# Patient Record
Sex: Male | Born: 2000 | Race: Black or African American | Hispanic: No | Marital: Single
Health system: Southern US, Community
[De-identification: ages and names within clinical notes are randomized; demographics above are authoritative.]

---
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
## 2019-02-28 ENCOUNTER — Emergency Department (HOSPITAL_COMMUNITY): Payer: No Typology Code available for payment source

## 2019-02-28 ENCOUNTER — Emergency Department (HOSPITAL_COMMUNITY)
Admission: EM | Admit: 2019-02-28 | Discharge: 2019-02-28 | Disposition: A | Discharge status: Home / Self Care | Payer: No Typology Code available for payment source | Attending: Emergency Medicine | Admitting: Emergency Medicine

## 2019-02-28 ENCOUNTER — Encounter (HOSPITAL_COMMUNITY): Payer: Self-pay

## 2019-02-28 ENCOUNTER — Other Ambulatory Visit: Payer: Self-pay

## 2019-02-28 DIAGNOSIS — M25511 Pain in right shoulder: Secondary | ICD-10-CM | POA: Insufficient documentation

## 2019-02-28 DIAGNOSIS — R1030 Lower abdominal pain, unspecified: Secondary | ICD-10-CM | POA: Insufficient documentation

## 2019-02-28 DIAGNOSIS — M25552 Pain in left hip: Secondary | ICD-10-CM | POA: Diagnosis present

## 2019-02-28 DIAGNOSIS — Z7722 Contact with and (suspected) exposure to environmental tobacco smoke (acute) (chronic): Secondary | ICD-10-CM | POA: Diagnosis not present

## 2019-02-28 DIAGNOSIS — Z79899 Other long term (current) drug therapy: Secondary | ICD-10-CM | POA: Diagnosis not present

## 2019-02-28 DIAGNOSIS — R52 Pain, unspecified: Secondary | ICD-10-CM

## 2019-02-28 MED ORDER — SODIUM CHLORIDE (PF) 0.9 % IJ SOLN
INTRAMUSCULAR | Status: AC
  Filled 2019-02-28: qty 50

## 2019-02-28 MED ORDER — IOHEXOL 300 MG/ML  SOLN
100.0000 mL | Freq: Once | INTRAMUSCULAR | Status: AC | PRN
  Administered 2019-02-28: 100 mL via INTRAVENOUS

## 2019-02-28 MED ORDER — OXYCODONE HCL 5 MG PO TABS
5.0000 mg | ORAL_TABLET | Freq: Once | ORAL | Status: AC
  Administered 2019-02-28: 5 mg via ORAL
  Filled 2019-02-28: qty 1

## 2019-02-28 MED ORDER — NAPROXEN 500 MG PO TABS: 500.0000 mg | ORAL_TABLET | Freq: Two times a day (BID) | ORAL | 0 refills | Status: DC | PRN

## 2019-02-28 MED ORDER — METHOCARBAMOL 500 MG PO TABS: 500.0000 mg | ORAL_TABLET | Freq: Two times a day (BID) | ORAL | 0 refills | Status: AC | PRN

## 2019-02-28 MED ORDER — ACETAMINOPHEN 500 MG PO TABS
1000.0000 mg | ORAL_TABLET | Freq: Once | ORAL | Status: AC
  Administered 2019-02-28: 1000 mg via ORAL
  Filled 2019-02-28: qty 2
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
## 2019-02-28 NOTE — ED Triage Notes (Signed)
Pt also states left shoudler pain.

## 2019-02-28 NOTE — ED Notes (Signed)
Patient transported to CT 

## 2019-02-28 NOTE — Discharge Instructions (Signed)

## 2019-12-23 ENCOUNTER — Emergency Department: Payer: Medicaid Other

## 2019-12-23 ENCOUNTER — Emergency Department
Admission: EM | Admit: 2019-12-23 | Discharge: 2019-12-23 | Disposition: A | Discharge status: Home / Self Care | Payer: Medicaid Other | Attending: Emergency Medicine | Admitting: Emergency Medicine

## 2019-12-23 ENCOUNTER — Other Ambulatory Visit: Payer: Self-pay

## 2019-12-23 ENCOUNTER — Encounter: Payer: Self-pay | Admitting: Emergency Medicine

## 2019-12-23 DIAGNOSIS — Z79899 Other long term (current) drug therapy: Secondary | ICD-10-CM | POA: Diagnosis not present

## 2019-12-23 DIAGNOSIS — Z7722 Contact with and (suspected) exposure to environmental tobacco smoke (acute) (chronic): Secondary | ICD-10-CM | POA: Diagnosis not present

## 2019-12-23 DIAGNOSIS — X509XXA Other and unspecified overexertion or strenuous movements or postures, initial encounter: Secondary | ICD-10-CM | POA: Insufficient documentation

## 2019-12-23 DIAGNOSIS — S4992XA Unspecified injury of left shoulder and upper arm, initial encounter: Secondary | ICD-10-CM | POA: Diagnosis present

## 2019-12-23 DIAGNOSIS — Y929 Unspecified place or not applicable: Secondary | ICD-10-CM | POA: Diagnosis not present

## 2019-12-23 DIAGNOSIS — S43015A Anterior dislocation of left humerus, initial encounter: Secondary | ICD-10-CM

## 2019-12-23 DIAGNOSIS — Y999 Unspecified external cause status: Secondary | ICD-10-CM | POA: Diagnosis not present

## 2019-12-23 DIAGNOSIS — Y9367 Activity, basketball: Secondary | ICD-10-CM | POA: Insufficient documentation

## 2019-12-23 MED ORDER — OXYCODONE-ACETAMINOPHEN 5-325 MG PO TABS
1.0000 | ORAL_TABLET | ORAL | Status: AC | PRN
  Administered 2019-12-23 (×2): 1 via ORAL
  Filled 2019-12-23 (×2): qty 1

## 2019-12-23 MED ORDER — KETOROLAC TROMETHAMINE 10 MG PO TABS
10.0000 mg | ORAL_TABLET | Freq: Once | ORAL | Status: AC
  Administered 2019-12-23: 23:00:00 10 mg via ORAL

## 2019-12-23 MED ORDER — OXYCODONE-ACETAMINOPHEN 5-325 MG PO TABS: 1.0000 | ORAL_TABLET | Freq: Four times a day (QID) | ORAL | 0 refills | Status: AC | PRN

## 2019-12-23 MED ORDER — HYDROMORPHONE HCL 1 MG/ML IJ SOLN
1.0000 mg | Freq: Once | INTRAMUSCULAR | Status: AC
  Administered 2019-12-23: 20:00:00 1 mg via INTRAMUSCULAR

## 2019-12-23 MED ORDER — HYDROMORPHONE HCL 1 MG/ML IJ SOLN
INTRAMUSCULAR | Status: AC
  Filled 2019-12-23: qty 1

## 2019-12-23 MED ORDER — KETOROLAC TROMETHAMINE 10 MG PO TABS: 10.0000 mg | ORAL_TABLET | Freq: Four times a day (QID) | ORAL | 0 refills | Status: AC | PRN
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
## 2019-12-23 NOTE — ED Provider Notes (Signed)
[facility] Emergency Department Provider Note  ____________________________________________  Time seen: Approximately 10:54 PM  I have reviewed the triage vital signs and the nursing notes.   HISTORY  Chief Complaint Shoulder Pain    HPI Peter Farrell is a 19 y.o. male with history of shoulder dislocation status post rotator cuff surgery in the past who comes the ED complaining of left shoulder pain feeling like a dislocation that started about 2 hours prior to arrival.  Injury occurred while playing basketball.  Sudden severe pain, nonradiating, worse with movement, no alleviating factors.  No other injuries.     History reviewed. No pertinent past medical history.   There are no problems to display for this patient.    History reviewed. No pertinent surgical history.   Prior to Admission medications   Medication Sig Start Date End Date Taking? Authorizing Provider  clotrimazole-betamethasone (LOTRISONE) cream Apply 1 application topically 2 (two) times daily. 06/29/16   Felecia Shelling, DPM  fluticasone (FLONASE) 50 MCG/ACT nasal spray Place 2 sprays into both nostrils daily. 02/20/16   Joanna Puff, MD  ibuprofen (ADVIL,MOTRIN) 400 MG tablet Take 1 tablet (400 mg total) by mouth every 6 (six) hours as needed. 06/17/15   Mady Gemma, PA-C  ketorolac (TORADOL) 10 MG tablet Take 1 tablet (10 mg total) by mouth every 6 (six) hours as needed for moderate pain. 12/23/19   Sharman Cheek, MD  loratadine (CLARITIN) 10 MG tablet Take 1 tablet (10 mg total) by mouth daily. 05/12/16   Sharene Skeans, MD  methocarbamol (ROBAXIN) 500 MG tablet Take 1 tablet (500 mg total) by mouth 2 (two) times daily as needed for muscle spasms. 02/28/19   Ward, Chase Picket, PA-C  naproxen (NAPROSYN) 500 MG tablet Take 1 tablet (500 mg total) by mouth 2 (two) times daily as needed. 02/28/19   Ward, Chase Picket, PA-C  oxyCODONE-acetaminophen (PERCOCET) 5-325 MG tablet  Take 1 tablet by mouth every 6 (six) hours as needed for severe pain. 12/23/19 12/22/20  Sharman Cheek, MD  terbinafine (LAMISIL) 250 MG tablet Take 1 tablet (250 mg total) by mouth daily. 07/15/16   Felecia Shelling, DPM     Allergies Patient has no known allergies.   History reviewed. No pertinent family history.  Social History Social History   Tobacco Use  . Smoking status: Passive Smoke Exposure - Never Smoker  . Smokeless tobacco: Never Used  Substance Use Topics  . Alcohol use: No  . Drug use: Not on file    Review of Systems  Constitutional:   No fever or chills.  ENT:   No sore throat. No rhinorrhea. Cardiovascular:   No chest pain or syncope. Respiratory:   No dyspnea or cough. Gastrointestinal:   Negative for abdominal pain, vomiting and diarrhea.  Musculoskeletal:   Left shoulder pain as above All other systems reviewed and are negative except as documented above in ROS and HPI.  ____________________________________________   PHYSICAL EXAM:  VITAL SIGNS: ED Triage Vitals  Enc Vitals Group     BP 12/23/19 1950 135/88     Pulse Rate 12/23/19 1950 68     Resp 12/23/19 1950 17     Temp 12/23/19 1950 98 F (36.7 C)     Temp Source 12/23/19 1950 Oral     SpO2 12/23/19 1950 100 %     Weight --      Height --      Head Circumference --  Peak Flow --      Pain Score 12/23/19 2111 10     Pain Loc --      Pain Edu? --      Excl. in GC? --     Vital signs reviewed, nursing assessments reviewed.   Constitutional:   Alert and oriented. Non-toxic appearance. Eyes:   Conjunctivae are normal. EOMI. ENT      Head:   Normocephalic and atraumatic.      Mouth/Throat:   MMM      Neck:   No meningismus. Full ROM. Hematological/Lymphatic/Immunilogical:   No cervical lymphadenopathy. Cardiovascular:   RRR. Symmetric bilateral radial and DP pulses.  No murmurs. Cap refill less than 2 seconds. Respiratory:   Normal respiratory effort without  tachypnea/retractions. Breath sounds are clear and equal bilaterally. No wheezes/rales/rhonchi. Musculoskeletal:   Left shoulder held in internal rotation.  Severe pain with movement.  Emptiness over the lateral deltoid and anterior fullness, clinically consistent with an anterior dislocation. Neurologic:   Normal speech and language.  Motor grossly intact. No acute focal neurologic deficits are appreciated.  Skin:    Skin is warm, dry and intact. No rash noted.  No wounds.  ____________________________________________    LABS (pertinent positives/negatives) (all labs ordered are listed, but only abnormal results are displayed) Labs Reviewed - No data to display ____________________________________________   EKG  ____________________________________________    RADIOLOGY  DG Shoulder Left  Result Date: 12/23/2019 CLINICAL DATA:  Larey Seat playing basketball today, pain EXAM: LEFT SHOULDER - 2+ VIEW COMPARISON:  None. FINDINGS: Internal rotation, external rotation, transscapular views of the left shoulder demonstrate anterior glenohumeral dislocation. No associated fracture. Acromioclavicular joint is unremarkable. Left chest is clear. IMPRESSION: 1. Anterior glenohumeral dislocation. Electronically Signed   By: Sharlet Salina M.D.   On: 12/23/2019 20:19   DG Shoulder Left Portable  Result Date: 12/23/2019 CLINICAL DATA:  Post reduction radiographs of the anterior dislocation. EXAM: LEFT SHOULDER COMPARISON:  Same day radiographs FINDINGS: Successful relocation of the humeral head, now seated normally within the glenoid. A Hill-Sachs deformity is present along the posterolateral humeral head. No irregularity is seen along the anteroinferior glenoid is suggest a bony Bankart lesion. Mild residual soft tissue swelling. Included portion of the left chest wall and lung are unremarkable. IMPRESSION: 1. Successful relocation of the humeral head, now seated within the glenoid. 2. Hill-Sachs deformity  along the posterolateral humeral head. 3. No convincing evidence of a bony Bankart lesion. Electronically Signed   By: Kreg Shropshire M.D.   On: 12/23/2019 21:40    ____________________________________________   PROCEDURES .Ortho Injury Treatment  Date/Time: 12/23/2019 10:56 PM Performed by: Sharman Cheek, MD Authorized by: Sharman Cheek, MD   Consent:    Consent obtained:  Verbal   Consent given by:  Patient   Risks discussed:  Irreducible dislocation, recurrent dislocation, stiffness and restricted joint movement   Alternatives discussed:  No treatment, alternative treatment and referralInjury location: shoulder Location details: left shoulder Injury type: dislocation Dislocation type: anterior Hill-Sachs deformity: yes Chronicity: recurrent Pre-procedure neurovascular assessment: neurovascularly intact Pre-procedure distal perfusion: normal Pre-procedure neurological function: normal Pre-procedure range of motion: reduced  Anesthesia: Local anesthesia used: no  Patient sedated: NoManipulation performed: yes Reduction method: external rotation (Prakash method) Reduction successful: yes X-ray confirmed reduction: yes Immobilization: sling Post-procedure neurovascular assessment: post-procedure neurovascularly intact Post-procedure distal perfusion: normal Post-procedure neurological function: normal Post-procedure range of motion: normal Patient tolerance: patient tolerated the procedure well with no immediate complications     ____________________________________________  CLINICAL IMPRESSION / ASSESSMENT AND PLAN / ED COURSE  Pertinent labs & imaging results that were available during my care of the patient were reviewed by me and considered in my medical decision making (see chart for details).  Peter Farrell was evaluated in Emergency Department on 12/23/2019 for the symptoms described in the history of present illness. He was evaluated in the context of  the global COVID-19 pandemic, which necessitated consideration that the patient might be at risk for infection with the SARS-CoV-2 virus that causes COVID-19. Institutional protocols and algorithms that pertain to the evaluation of patients at risk for COVID-19 are in a state of rapid change based on information released by regulatory bodies including the CDC and federal and state organizations. These policies and algorithms were followed during the patient's care in the ED.     Clinical Course as of Dec 23 2302  Sat Dec 23, 2019  2032 X-ray images viewed by me, shows anterior dislocation of left shoulder without fracture.  Radiology report reviewed as well.  No other apparent injuries on exam, vital signs are normal.  Patient unable to tolerate manipulation of the shoulder on arrival to treatment room, studies given Dilaudid 1 mg IM, and will reattempt after pain better controlled.   [PS]  2111 Reduced with Edmonia James method, will get repeat xr.    [PS]  2140 Repeat xray images interpreted by me, shows successful rduction, no fracture.     [PS]  2143 Radiology report confirms successful reduction. Notes hill sachs deformity at posterolateral humeral head. Unclear if acute given multiple prior dislocations. Will refer to ortho, control pain. Rec ice and nsaids.    [PS]    Clinical Course User Index [PS] Carrie Mew, MD     ____________________________________________   FINAL CLINICAL IMPRESSION(S) / ED DIAGNOSES    Final diagnoses:  Anterior shoulder dislocation, left, initial encounter     ED Discharge Orders         Ordered    ketorolac (TORADOL) 10 MG tablet  Every 6 hours PRN     12/23/19 2249    oxyCODONE-acetaminophen (PERCOCET) 5-325 MG tablet  Every 6 hours PRN     12/23/19 2249          Portions of this note were generated with dragon dictation software. Dictation errors may occur despite best attempts at proofreading.   Carrie Mew, MD 12/23/19  [phone]

## 2019-12-23 NOTE — ED Notes (Signed)
Patient to waiting room via wheelchair by EMS for left shoulder pain.  Patient with history of dislocation to left shoulder previous with torn rotator cuff.

## 2019-12-23 NOTE — ED Triage Notes (Signed)
Pt arrived via EMS for possible left shoulder dislocation. Pt reports this will be the 4th dislocation to said shoulder.

## 2019-12-23 NOTE — ED Notes (Signed)
Pt states meds are taking effect. Will notify MD.

## 2019-12-23 NOTE — ED Notes (Signed)
MD at bedside. Pt in pain. New order received. Pt given Dilaudid. Pt states he dislocated shoulder while playing basketball. Hx of same.

## 2019-12-23 NOTE — Discharge Instructions (Addendum)
Keep the sling immobilizer on at all times except showering for the next week and follow up with orthopedics.  Do not raise your arm above chest height until then to avoid dislocating again.
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
## 2021-06-09 IMAGING — DX DG SHOULDER 1V*L*
3 series · 3 of 3 positions shown · non-contrast
Comparison: Same day radiographs

CLINICAL DATA: Post reduction radiographs of the anterior
dislocation.

EXAM:
LEFT SHOULDER

[shoulder ap (1 of 2)]
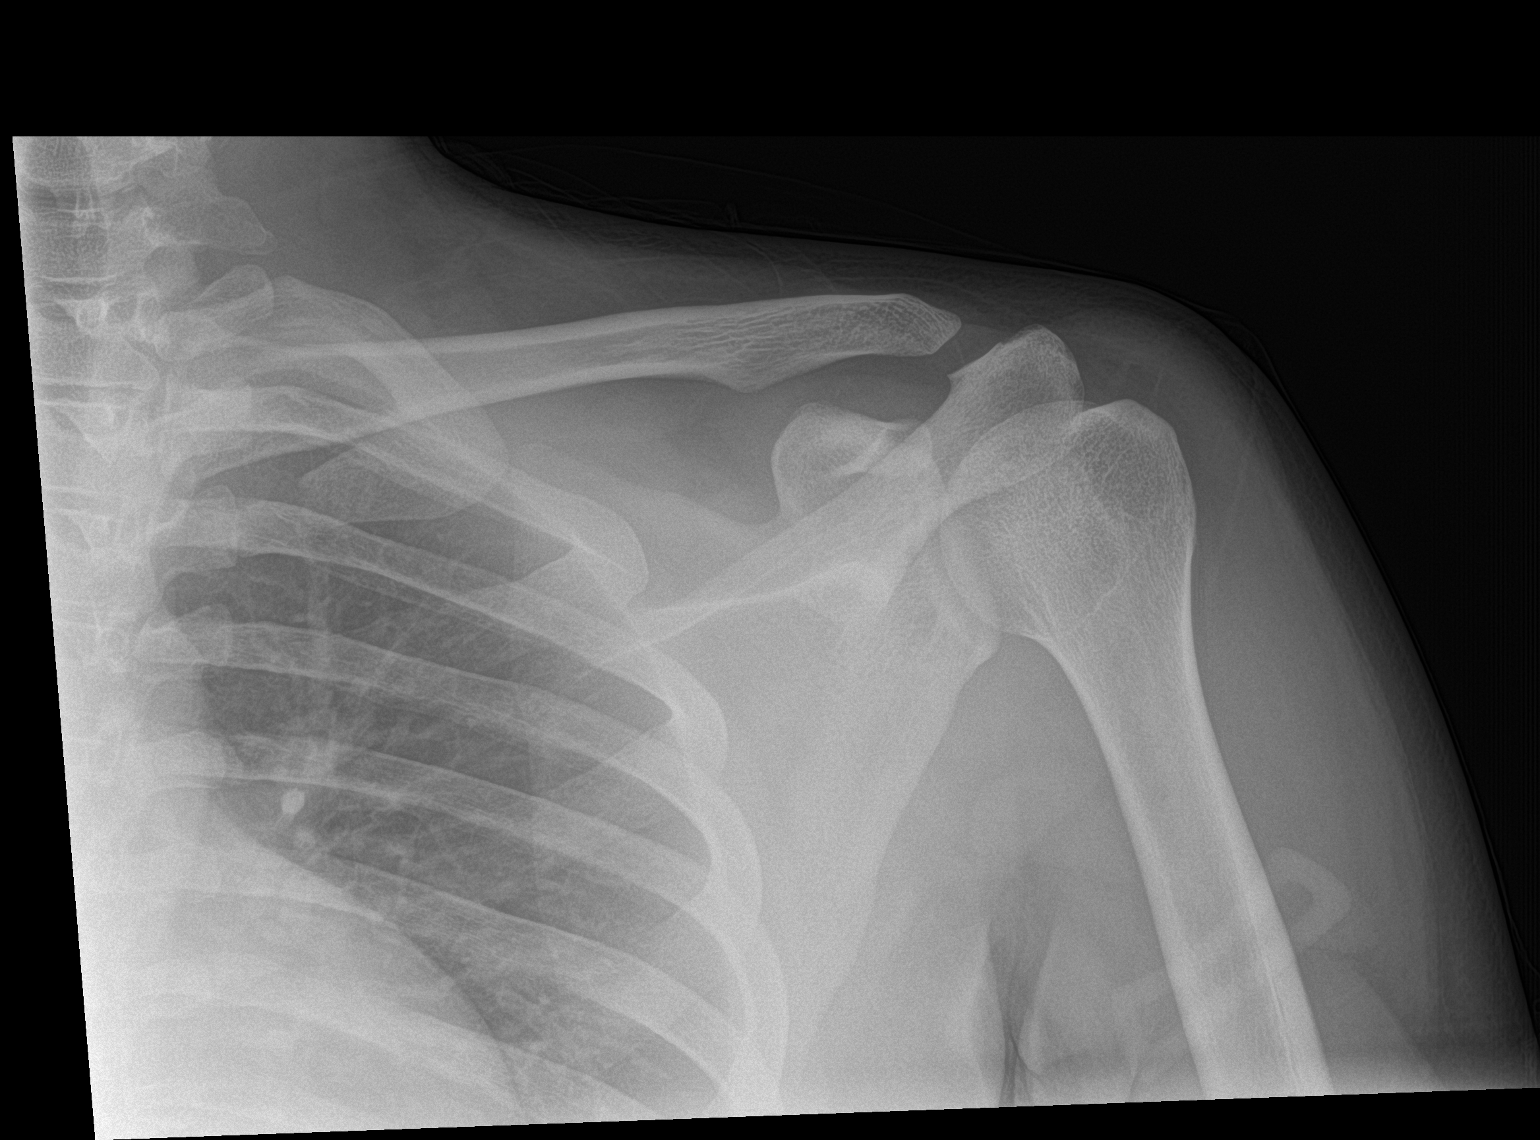

[shoulder ap (2 of 2)]
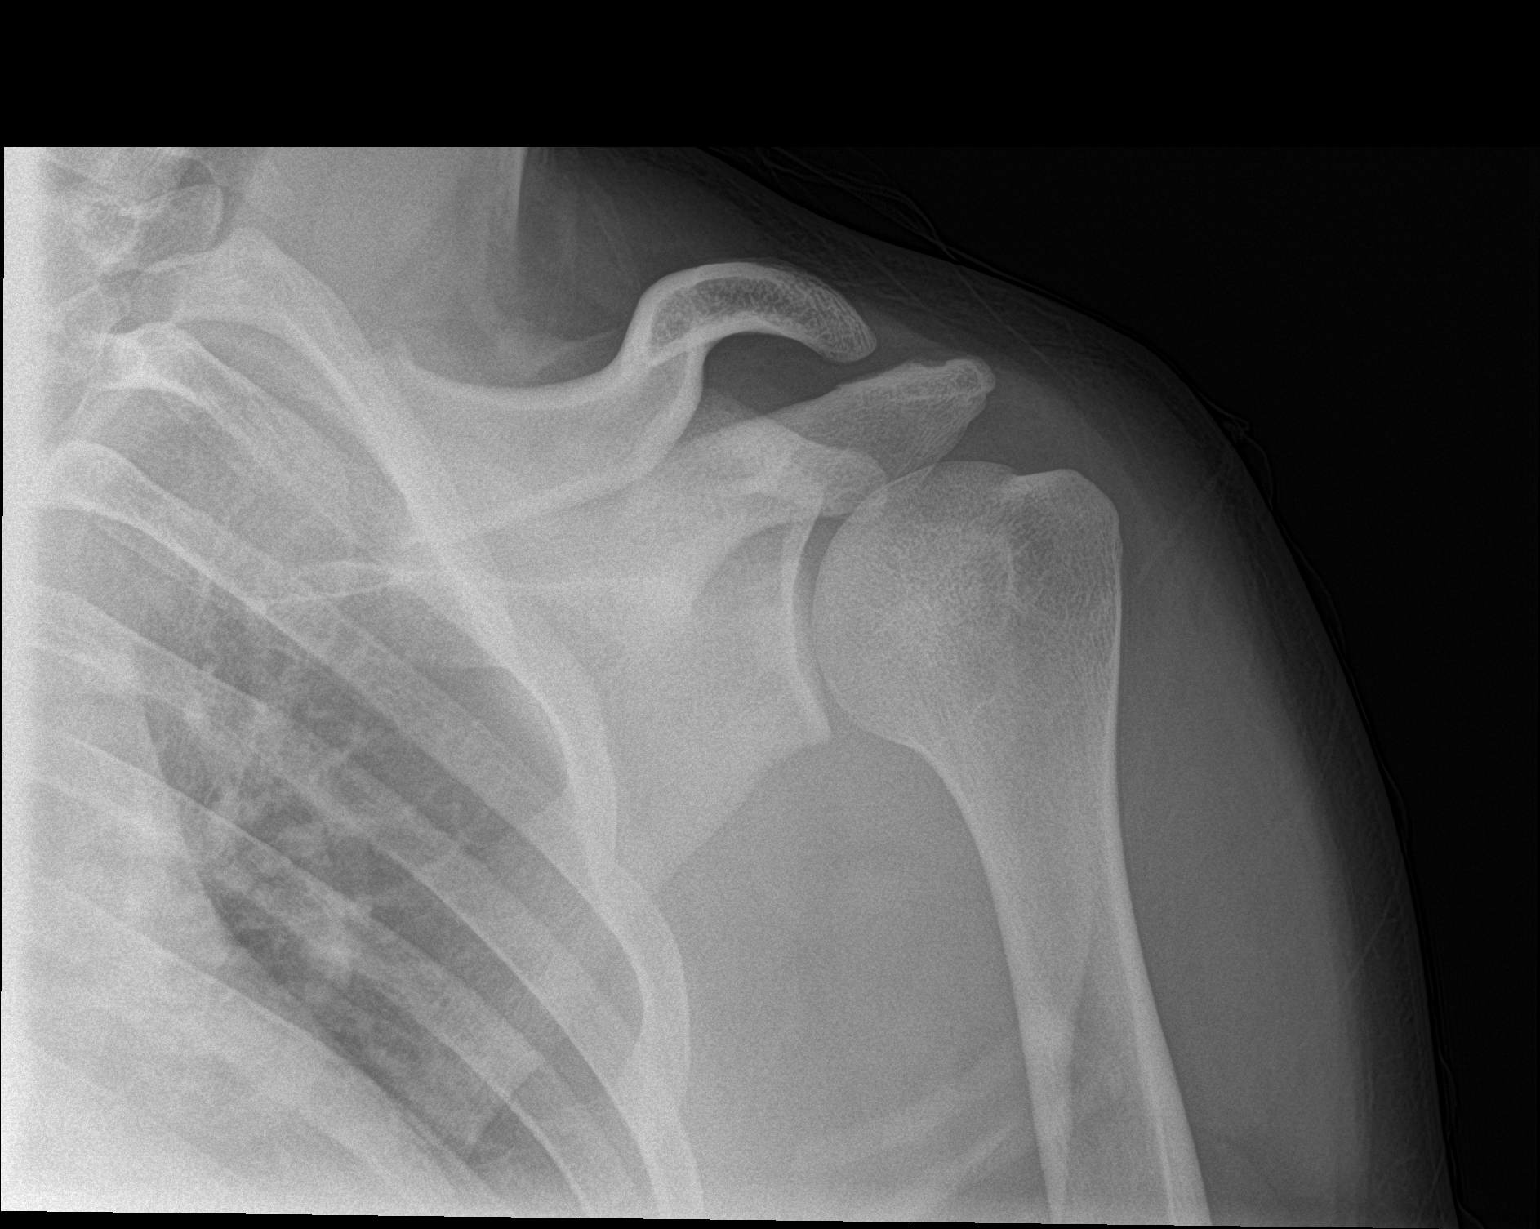

[shoulder obl]
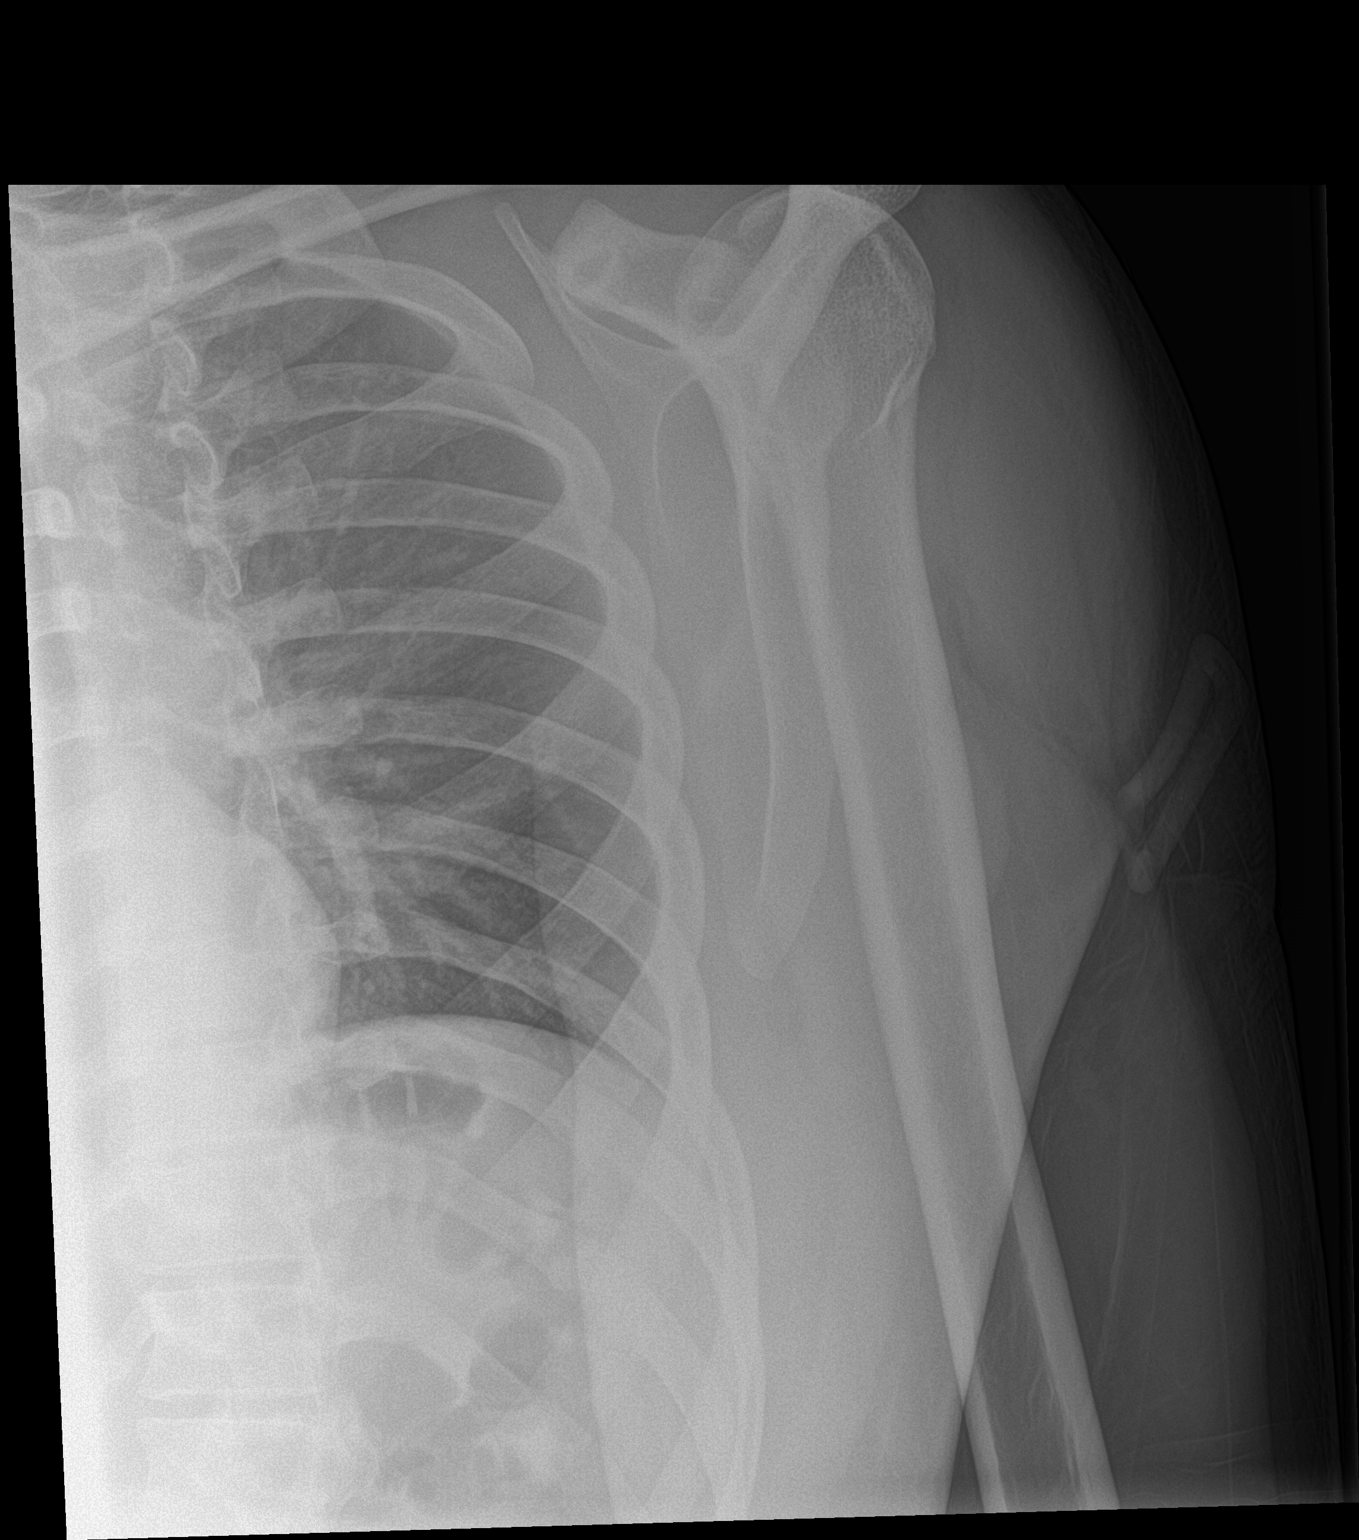

[3 of 3 positions shown; findings below may reference images not displayed]

FINDINGS: Successful relocation of the humeral head, now seated normally
within the glenoid. A Hill-Sachs deformity is present along the
posterolateral humeral head. No irregularity is seen along the
anteroinferior glenoid is suggest a bony Bankart lesion. Mild
residual soft tissue swelling. Included portion of the left chest
wall and lung are unremarkable.
IMPRESSION: 1. Successful relocation of the humeral head, now seated within the
glenoid.
2. Hill-Sachs deformity along the posterolateral humeral head.
3. No convincing evidence of a bony Bankart lesion.
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
## 2022-09-01 ENCOUNTER — Ambulatory Visit (HOSPITAL_COMMUNITY)
Admission: EM | Admit: 2022-09-01 | Discharge: 2022-09-01 | Disposition: A | Discharge status: Home / Self Care | Payer: Medicaid Other

## 2022-09-01 NOTE — ED Notes (Signed)
Called for triage no response 

## 2023-04-02 ENCOUNTER — Inpatient Hospital Stay (HOSPITAL_COMMUNITY): Admission: RE | Admit: 2023-04-02 | Payer: Medicaid Other | Source: Ambulatory Visit

## 2024-05-01 ENCOUNTER — Emergency Department (HOSPITAL_COMMUNITY): Payer: Self-pay

## 2024-05-01 ENCOUNTER — Emergency Department (HOSPITAL_COMMUNITY)
Admission: EM | Admit: 2024-05-01 | Discharge: 2024-05-01 | Disposition: A | Discharge status: Home / Self Care | Payer: Self-pay | Attending: Emergency Medicine | Admitting: Emergency Medicine

## 2024-05-01 ENCOUNTER — Other Ambulatory Visit: Payer: Self-pay

## 2024-05-01 DIAGNOSIS — Y906 Blood alcohol level of 120-199 mg/100 ml: Secondary | ICD-10-CM | POA: Insufficient documentation

## 2024-05-01 DIAGNOSIS — S0001XA Abrasion of scalp, initial encounter: Secondary | ICD-10-CM

## 2024-05-01 DIAGNOSIS — Z23 Encounter for immunization: Secondary | ICD-10-CM | POA: Diagnosis not present

## 2024-05-01 DIAGNOSIS — F1090 Alcohol use, unspecified, uncomplicated: Secondary | ICD-10-CM | POA: Diagnosis not present

## 2024-05-01 DIAGNOSIS — Y92410 Unspecified street and highway as the place of occurrence of the external cause: Secondary | ICD-10-CM | POA: Diagnosis not present

## 2024-05-01 DIAGNOSIS — S0103XA Puncture wound without foreign body of scalp, initial encounter: Secondary | ICD-10-CM | POA: Diagnosis not present

## 2024-05-01 DIAGNOSIS — S0990XA Unspecified injury of head, initial encounter: Secondary | ICD-10-CM | POA: Diagnosis present

## 2024-05-01 LAB — I-STAT CG4 LACTIC ACID, ED: Lactic Acid, Venous: 1.6 mmol/L (ref 0.5–1.9)

## 2024-05-01 LAB — ETHANOL: Alcohol, Ethyl (B): 157 mg/dL — ABNORMAL HIGH (ref <15)

## 2024-05-01 LAB — COMPREHENSIVE METABOLIC PANEL WITH GFR
ALT: 18 U/L (ref 0–44)
AST: 26 U/L (ref 15–41)
Albumin: 3.8 g/dL (ref 3.5–5.0)
Alkaline Phosphatase: 69 U/L (ref 38–126)
Anion gap: 8 (ref 5–15)
BUN: <5 mg/dL — ABNORMAL LOW (ref 6–20)
CO2: 24 mmol/L (ref 22–32)
Calcium: 8.7 mg/dL — ABNORMAL LOW (ref 8.9–10.3)
Chloride: 108 mmol/L (ref 98–111)
Creatinine, Ser: 1.11 mg/dL (ref 0.61–1.24)
GFR, Estimated: >60 mL/min (ref >60)
Glucose, Bld: 90 mg/dL (ref 70–99)
Potassium: 3.3 mmol/L — ABNORMAL LOW (ref 3.5–5.1)
Sodium: 140 mmol/L (ref 135–145)
Total Bilirubin: 0.9 mg/dL (ref 0.0–1.2)
Total Protein: 7 g/dL (ref 6.5–8.1)

## 2024-05-01 LAB — PROTIME-INR
INR: 1.1 (ref 0.8–1.2)
Prothrombin Time: 14.3 s (ref 11.4–15.2)

## 2024-05-01 LAB — CBC
HCT: 44.6 % (ref 39.0–52.0)
Hemoglobin: 15.3 g/dL (ref 13.0–17.0)
MCH: 31.9 pg (ref 26.0–34.0)
MCHC: 34.3 g/dL (ref 30.0–36.0)
MCV: 93.1 fL (ref 80.0–100.0)
Platelets: 239 K/uL (ref 150–400)
RBC: 4.79 MIL/uL (ref 4.22–5.81)
RDW: 14.6 % (ref 11.5–15.5)
WBC: 5.6 K/uL (ref 4.0–10.5)
nRBC: 0 % (ref 0.0–0.2)

## 2024-05-01 LAB — I-STAT CHEM 8, ED
BUN: <3 mg/dL — ABNORMAL LOW (ref 6–20)
Calcium, Ion: 1.1 mmol/L — ABNORMAL LOW (ref 1.15–1.40)
Chloride: 104 mmol/L (ref 98–111)
Creatinine, Ser: 1.2 mg/dL (ref 0.61–1.24)
Glucose, Bld: 90 mg/dL (ref 70–99)
HCT: 47 % (ref 39.0–52.0)
Hemoglobin: 16 g/dL (ref 13.0–17.0)
Potassium: 3.3 mmol/L — ABNORMAL LOW (ref 3.5–5.1)
Sodium: 143 mmol/L (ref 135–145)
TCO2: 24 mmol/L (ref 22–32)

## 2024-05-01 MED ORDER — SODIUM CHLORIDE 0.9 % IV BOLUS
1000.0000 mL | Freq: Once | INTRAVENOUS | Status: AC
  Administered 2024-05-01: 1000 mL via INTRAVENOUS

## 2024-05-01 MED ORDER — TETANUS-DIPHTH-ACELL PERTUSSIS 5-2.5-18.5 LF-MCG/0.5 IM SUSY
PREFILLED_SYRINGE | INTRAMUSCULAR | Status: AC
  Administered 2024-05-01: 1 mL via INTRAMUSCULAR
  Filled 2024-05-01: qty 0.5

## 2024-05-01 MED ORDER — IOHEXOL 350 MG/ML SOLN
75.0000 mL | Freq: Once | INTRAVENOUS | Status: AC | PRN
  Administered 2024-05-01: 75 mL via INTRAVENOUS

## 2024-05-01 NOTE — Progress Notes (Signed)
 Orthopedic Tech Progress Note Patient Details:  [name] 07-Nov-2000 [id number]  Patient ID: Peter Farrell Arrant, male   DOB: May 24, 2001, 24 y.o.   MRN: [id number] I attended trauma page. Chandra Dorn PARAS 05/01/2024, 7:02 AM

## 2024-05-01 NOTE — ED Notes (Signed)
 RN gave pt jewelry.
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
## 2024-05-01 NOTE — ED Notes (Signed)
RN updated sister

## 2024-07-03 ENCOUNTER — Emergency Department (HOSPITAL_COMMUNITY)
Admission: EM | Admit: 2024-07-03 | Discharge: 2024-07-03 | Disposition: A | Discharge status: Home / Self Care | Payer: Self-pay

## 2024-07-03 ENCOUNTER — Encounter (HOSPITAL_COMMUNITY): Payer: Self-pay

## 2024-07-03 DIAGNOSIS — L02818 Cutaneous abscess of other sites: Secondary | ICD-10-CM

## 2024-07-03 DIAGNOSIS — L02415 Cutaneous abscess of right lower limb: Secondary | ICD-10-CM | POA: Insufficient documentation

## 2024-07-03 DIAGNOSIS — L02214 Cutaneous abscess of groin: Secondary | ICD-10-CM | POA: Insufficient documentation

## 2024-07-03 MED ORDER — SULFAMETHOXAZOLE-TRIMETHOPRIM 800-160 MG PO TABS: 1.0000 | ORAL_TABLET | Freq: Two times a day (BID) | ORAL | 0 refills | Status: AC

## 2024-07-03 MED ORDER — NAPROXEN 500 MG PO TABS: 500.0000 mg | ORAL_TABLET | Freq: Two times a day (BID) | ORAL | 0 refills | Status: AC | PRN

## 2024-07-03 NOTE — Discharge Instructions (Addendum)
 Please continue to apply warm moist compress to affected area to aid with healing.  Take antibiotic as prescribed.  You may need to follow-up with a dermatologist to be evaluated to see if you have a condition called Hidradenitis Suppurativa

## 2024-07-03 NOTE — ED Provider Notes (Signed)
 [place] EMERGENCY DEPARTMENT AT [facility] Provider Note   CSN: [id number] Arrival date & time: 07/03/24  1147     Patient presents with: No chief complaint on file.   Peter Farrell is a 23 y.o. male.   The history is provided by the patient and medical records. No language interpreter was used.     23 year old male presenting with complaints of skin infection.  For the past 4 days he noticed 2 boils that appeared on his right groin area and tender to palpation.  He has tried to use warm compress without adequate relief.  He does not endorse any fever chills no recent trauma no penile discharge, new sexual partner.  He has had similar abscess that appeared in his armpit in the past.  He has never been diagnosed with hidradenitis suppurativa.  He is up-to-date with tetanus.  Prior to Admission medications   Medication Sig Start Date End Date Taking? Authorizing Provider  clotrimazole -betamethasone  (LOTRISONE ) cream Apply 1 application topically 2 (two) times daily. 06/29/16   Janit Thresa HERO, DPM  fluticasone  (FLONASE ) 50 MCG/ACT nasal spray Place 2 sprays into both nostrils daily. 02/20/16   Federico Camelia RAMAN, MD  ibuprofen  (ADVIL ,MOTRIN ) 400 MG tablet Take 1 tablet (400 mg total) by mouth every 6 (six) hours as needed. 06/17/15   Veronica Almarie BROCKS, PA-C  ketorolac  (TORADOL ) 10 MG tablet Take 1 tablet (10 mg total) by mouth every 6 (six) hours as needed for moderate pain. 12/23/19   Viviann Pastor, MD  loratadine  (CLARITIN ) 10 MG tablet Take 1 tablet (10 mg total) by mouth daily. 05/12/16   Willaim Darnel, MD  methocarbamol  (ROBAXIN ) 500 MG tablet Take 1 tablet (500 mg total) by mouth 2 (two) times daily as needed for muscle spasms. 02/28/19   Ward, Jaime Pilcher, PA-C  naproxen  (NAPROSYN ) 500 MG tablet Take 1 tablet (500 mg total) by mouth 2 (two) times daily as needed. 02/28/19   Ward, Ami Copes, PA-C  terbinafine  (LAMISIL ) 250 MG tablet Take 1 tablet (250 mg total) by  mouth daily. 07/15/16   Janit Thresa HERO, DPM    Allergies: Patient has no known allergies.    Review of Systems  Constitutional:  Negative for fever.    Updated Vital Signs BP 136/84   Pulse 87   Temp 97.9 F (36.6 C)   Resp 14   SpO2 98%   Physical Exam Constitutional:      General: He is not in acute distress.    Appearance: He is well-developed.  HENT:     Head: Atraumatic.  Eyes:     Conjunctiva/sclera: Conjunctivae normal.  Genitourinary:    Comments: Two area of induration and mild fluctuance noted to the right inguinal and right medial thigh region with tenderness to palpation. Musculoskeletal:     Cervical back: Normal range of motion and neck supple.  Skin:    Findings: No rash.  Neurological:     Mental Status: He is alert.     (all labs ordered are listed, but only abnormal results are displayed) Labs Reviewed - No data to display  EKG: None  Radiology: No results found.   Procedures   Medications Ordered in the ED - No data to display                                  Medical Decision Making  BP 136/84   Pulse 87  Temp 97.9 F (36.6 C)   Resp 14   SpO2 98%   76:67 PM 23 year old male presenting with complaints of skin infection.  For the past 4 days he noticed 2 boils that appeared on his right groin area and tender to palpation.  He has tried to use warm compress without adequate relief.  He does not endorse any fever chills no recent trauma no penile discharge, new sexual partner.  He has had similar abscess that appeared in his armpit in the past.  He has never been diagnosed with hidradenitis suppurativa.  He is up-to-date with tetanus.  Exam notable for 2 small area of subcutaneous abscess to right groin and right medial thigh region.  Since patient has had abscess to his armpit in the past and have recurrent skin lesion I suspect he is likely having hidradenitis suppurativa.  I do not think I&D is indicated at this time.  I will  prescribe antibiotic and nonopiate pain medication for treatment control.  Recommend outpatient dermatology follow-up.  Considered STI but felt less likely.  No hernia no lymphangitis     Final diagnoses:  Cutaneous abscess of other site    ED Discharge Orders          Ordered    sulfamethoxazole-trimethoprim (BACTRIM DS) 800-160 MG tablet  2 times daily        07/03/24 1358    naproxen  (NAPROSYN ) 500 MG tablet  2 times daily PRN        07/03/24 1358               Nivia Colon, PA-C 07/03/24 1359    Kammerer, Megan L, DO 07/04/24 [phone]

## 2024-07-03 NOTE — ED Notes (Signed)
 Pt completely assessed by EDP and set to discharge. No RN assessment performed.  Pt provided discharge instructions and prescription information. Pt was given the opportunity to ask questions and questions were answered.

## 2024-07-03 NOTE — ED Triage Notes (Signed)
 Pt reports has 2 boils in his right groin, first noticed Friday, tried to treat at home, no improvement
